# Patient Record
Sex: Female | Born: 1985 | Race: White | Hispanic: No | Marital: Single | Smoking: Never smoker
Health system: Southern US, Community
[De-identification: ages and names within clinical notes are randomized; demographics above are authoritative.]

## PROBLEM LIST (undated history)

## (undated) DIAGNOSIS — H6990 Unspecified Eustachian tube disorder, unspecified ear: Secondary | ICD-10-CM

## (undated) DIAGNOSIS — L719 Rosacea, unspecified: Secondary | ICD-10-CM

## (undated) DIAGNOSIS — L732 Hidradenitis suppurativa: Secondary | ICD-10-CM

## (undated) DIAGNOSIS — H6980 Other specified disorders of Eustachian tube, unspecified ear: Secondary | ICD-10-CM

## (undated) DIAGNOSIS — F411 Generalized anxiety disorder: Secondary | ICD-10-CM

## (undated) DIAGNOSIS — Q251 Coarctation of aorta: Secondary | ICD-10-CM

## (undated) DIAGNOSIS — B349 Viral infection, unspecified: Secondary | ICD-10-CM

## (undated) DIAGNOSIS — I10 Essential (primary) hypertension: Secondary | ICD-10-CM

## (undated) DIAGNOSIS — J309 Allergic rhinitis, unspecified: Secondary | ICD-10-CM

## (undated) HISTORY — DX: Coarctation of aorta: Q25.1

## (undated) HISTORY — DX: Rosacea, unspecified: L71.9

## (undated) HISTORY — DX: Generalized anxiety disorder: F41.1

## (undated) HISTORY — DX: Hidradenitis suppurativa: L73.2

## (undated) HISTORY — PX: ABLATION: SHX5711

## (undated) HISTORY — DX: Unspecified eustachian tube disorder, unspecified ear: H69.90

## (undated) HISTORY — DX: Allergic rhinitis, unspecified: J30.9

## (undated) HISTORY — DX: Other specified disorders of Eustachian tube, unspecified ear: H69.80

## (undated) HISTORY — DX: Essential (primary) hypertension: I10

## (undated) HISTORY — DX: Viral infection, unspecified: B34.9

---
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
## 2015-06-23 ENCOUNTER — Ambulatory Visit (INDEPENDENT_AMBULATORY_CARE_PROVIDER_SITE_OTHER): Payer: BLUE CROSS/BLUE SHIELD | Admitting: Podiatry

## 2015-06-23 ENCOUNTER — Encounter: Payer: Self-pay | Admitting: Podiatry

## 2015-06-23 ENCOUNTER — Ambulatory Visit (INDEPENDENT_AMBULATORY_CARE_PROVIDER_SITE_OTHER): Payer: BLUE CROSS/BLUE SHIELD

## 2015-06-23 VITALS — BP 119/73 | HR 69 | Resp 18

## 2015-06-23 DIAGNOSIS — M722 Plantar fascial fibromatosis: Secondary | ICD-10-CM | POA: Diagnosis not present

## 2015-06-23 MED ORDER — METHYLPREDNISOLONE 4 MG PO TBPK: ORAL_TABLET | ORAL | Status: DC

## 2015-06-23 MED ORDER — MELOXICAM 15 MG PO TABS: 15.0000 mg | ORAL_TABLET | Freq: Every day | ORAL | Status: DC

## 2015-06-23 NOTE — Patient Instructions (Signed)

## 2015-06-23 NOTE — Progress Notes (Signed)
   Subjective:    Patient ID: Joanne Acosta, female    DOB: 03-29-1986, 29 y.o.   MRN: [id number]  HPI i have some heel pain on both of my feet and it has been going on since may and hurts on the bottom and hurts in the morning. She states the majority of the pain occurs first thing in the morning and then after she's been standing at work. 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 She's tried nothing other than shoe gear change to alleviate her symptoms.    Review of Systems  All other systems reviewed and are negative.      Objective:   Physical Exam: 29 year old female presents with her mother today vital signs stable alert and oriented 3 in no apparent distress. Pulses are strongly palpable neurologic sensorium is intact per Semmes-Weinstein monofilament. Deep tendon reflexes are intact bilaterally muscle strength was 5 over 5 dorsiflexion plantar flexors and inverters and everters on physical musculatures intact. Orthopedic evaluation demonstrates a rectus foot bilateral. All joints distal to the ankle held full range of motion without crepitation. She has pain on palpation medial calcaneal tubercles bilateral heels. Radiographs confirm soft tissue increase in density at the plantar fascial calcaneal insertion site consistent with plantar fasciitis. Cutaneous evaluation demonstrates supple well-hydrated cutis no erythema edema and cellulitis drainage odor open wounds lesions or wounds. No signs of skin infection.        Assessment & Plan:  Assessment: Plantar fasciitis bilateral foot.  Plan: Discussed etiology pathology conservative versus surgical therapies. We discussed appropriate shoe gear stretching exercises ice therapy and shoe gear modifications. We started her on a Medrol Dosepak to be followed by meloxicam. I injected the bilateral heels today with Kenalog and local anesthetic placed her in plantar fascial  braces bilateral and single night splint. I recommended multiple types of shoe gear to help alleviate her symptoms. I will follow up with her 1 month.

## 2015-07-28 ENCOUNTER — Encounter: Payer: Self-pay | Admitting: Podiatry

## 2015-07-28 ENCOUNTER — Ambulatory Visit (INDEPENDENT_AMBULATORY_CARE_PROVIDER_SITE_OTHER): Payer: BLUE CROSS/BLUE SHIELD | Admitting: Podiatry

## 2015-07-28 DIAGNOSIS — M722 Plantar fascial fibromatosis: Secondary | ICD-10-CM | POA: Diagnosis not present

## 2015-07-28 NOTE — Progress Notes (Signed)
She presents stating that her plantar fasciitis is doing much better however but by the end of the day her feet hurt.  Objective: Vital signs are stable she is alert and oriented 3. Pain on palpation medial calcaneal tubercles bilateral. No calf pain. No open lesions or wounds.  Assessment: Plantar fasciitis bilateral improving.  Plan: Injected the bilateral heels today with Kenalog and local anesthetic. She will continue all conservative therapies and until completely well. Follow up with her in the near future.

## 2015-08-25 ENCOUNTER — Ambulatory Visit: Payer: BLUE CROSS/BLUE SHIELD | Admitting: Podiatry

## 2017-02-02 ENCOUNTER — Ambulatory Visit: Payer: Self-pay | Admitting: Cardiovascular Disease

## 2017-03-16 ENCOUNTER — Ambulatory Visit: Payer: Self-pay | Admitting: Cardiovascular Disease

## 2017-04-25 ENCOUNTER — Encounter: Payer: Self-pay | Admitting: *Deleted

## 2017-04-25 ENCOUNTER — Ambulatory Visit: Payer: Self-pay | Admitting: Internal Medicine

## 2017-06-19 ENCOUNTER — Ambulatory Visit: Payer: Self-pay | Admitting: Internal Medicine

## 2017-06-20 ENCOUNTER — Ambulatory Visit: Payer: Self-pay | Admitting: Internal Medicine

## 2017-09-19 ENCOUNTER — Ambulatory Visit: Payer: Self-pay | Admitting: Internal Medicine

## 2018-04-01 ENCOUNTER — Other Ambulatory Visit: Payer: Self-pay

## 2018-04-01 ENCOUNTER — Encounter: Payer: Self-pay | Admitting: Emergency Medicine

## 2018-04-01 ENCOUNTER — Emergency Department: Payer: PRIVATE HEALTH INSURANCE

## 2018-04-01 ENCOUNTER — Emergency Department
Admission: EM | Admit: 2018-04-01 | Discharge: 2018-04-01 | Disposition: A | Discharge status: Home / Self Care | Payer: PRIVATE HEALTH INSURANCE | Attending: Emergency Medicine | Admitting: Emergency Medicine

## 2018-04-01 DIAGNOSIS — Y9389 Activity, other specified: Secondary | ICD-10-CM | POA: Insufficient documentation

## 2018-04-01 DIAGNOSIS — M542 Cervicalgia: Secondary | ICD-10-CM | POA: Insufficient documentation

## 2018-04-01 DIAGNOSIS — M545 Low back pain: Secondary | ICD-10-CM | POA: Diagnosis not present

## 2018-04-01 DIAGNOSIS — Y92410 Unspecified street and highway as the place of occurrence of the external cause: Secondary | ICD-10-CM | POA: Insufficient documentation

## 2018-04-01 DIAGNOSIS — M25512 Pain in left shoulder: Secondary | ICD-10-CM | POA: Insufficient documentation

## 2018-04-01 DIAGNOSIS — Z79899 Other long term (current) drug therapy: Secondary | ICD-10-CM | POA: Insufficient documentation

## 2018-04-01 DIAGNOSIS — Y998 Other external cause status: Secondary | ICD-10-CM | POA: Diagnosis not present

## 2018-04-01 DIAGNOSIS — I10 Essential (primary) hypertension: Secondary | ICD-10-CM | POA: Insufficient documentation

## 2018-04-01 LAB — POC URINE PREG, ED: Preg Test, Ur: NEGATIVE

## 2018-04-01 MED ORDER — METHOCARBAMOL 500 MG PO TABS: 500.0000 mg | ORAL_TABLET | Freq: Three times a day (TID) | ORAL | 0 refills | Status: AC | PRN

## 2018-04-01 MED ORDER — KETOROLAC TROMETHAMINE 30 MG/ML IJ SOLN
30.0000 mg | Freq: Once | INTRAMUSCULAR | Status: AC
  Administered 2018-04-01: 30 mg via INTRAMUSCULAR
  Filled 2018-04-01: qty 1

## 2018-04-01 MED ORDER — MELOXICAM 15 MG PO TABS: 15.0000 mg | ORAL_TABLET | Freq: Every day | ORAL | 1 refills | Status: AC

## 2018-04-01 MED ORDER — METHOCARBAMOL 500 MG PO TABS
500.0000 mg | ORAL_TABLET | Freq: Once | ORAL | Status: AC
  Administered 2018-04-01: 500 mg via ORAL
  Filled 2018-04-01: qty 1
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
## 2019-07-09 ENCOUNTER — Other Ambulatory Visit: Payer: PRIVATE HEALTH INSURANCE

## 2019-07-09 ENCOUNTER — Ambulatory Visit: Payer: PRIVATE HEALTH INSURANCE | Attending: Internal Medicine

## 2019-07-09 DIAGNOSIS — Z20828 Contact with and (suspected) exposure to other viral communicable diseases: Secondary | ICD-10-CM | POA: Insufficient documentation

## 2019-07-09 DIAGNOSIS — Z20822 Contact with and (suspected) exposure to covid-19: Secondary | ICD-10-CM

## 2019-07-10 LAB — NOVEL CORONAVIRUS, NAA: SARS-CoV-2, NAA: NOT DETECTED

## 2019-09-01 ENCOUNTER — Ambulatory Visit: Payer: PRIVATE HEALTH INSURANCE | Attending: Internal Medicine

## 2019-09-01 DIAGNOSIS — Z20822 Contact with and (suspected) exposure to covid-19: Secondary | ICD-10-CM

## 2019-09-02 LAB — SPECIMEN STATUS REPORT

## 2019-09-02 LAB — NOVEL CORONAVIRUS, NAA: SARS-CoV-2, NAA: NOT DETECTED

## 2021-02-15 DIAGNOSIS — I4892 Unspecified atrial flutter: Secondary | ICD-10-CM | POA: Insufficient documentation

## 2021-10-29 DIAGNOSIS — Q249 Congenital malformation of heart, unspecified: Secondary | ICD-10-CM | POA: Insufficient documentation

## 2021-12-08 ENCOUNTER — Other Ambulatory Visit: Payer: Self-pay

## 2021-12-08 ENCOUNTER — Encounter: Payer: Self-pay | Admitting: Emergency Medicine

## 2021-12-08 ENCOUNTER — Emergency Department
Admission: EM | Admit: 2021-12-08 | Discharge: 2021-12-08 | Disposition: A | Discharge status: Home / Self Care | Payer: Managed Care, Other (non HMO) | Attending: Emergency Medicine | Admitting: Emergency Medicine

## 2021-12-08 ENCOUNTER — Emergency Department: Payer: Managed Care, Other (non HMO)

## 2021-12-08 DIAGNOSIS — M25551 Pain in right hip: Secondary | ICD-10-CM | POA: Insufficient documentation

## 2021-12-08 DIAGNOSIS — M545 Low back pain, unspecified: Secondary | ICD-10-CM | POA: Diagnosis not present

## 2021-12-08 DIAGNOSIS — S0990XA Unspecified injury of head, initial encounter: Secondary | ICD-10-CM | POA: Diagnosis present

## 2021-12-08 DIAGNOSIS — Z7901 Long term (current) use of anticoagulants: Secondary | ICD-10-CM | POA: Diagnosis not present

## 2021-12-08 DIAGNOSIS — S0181XA Laceration without foreign body of other part of head, initial encounter: Secondary | ICD-10-CM | POA: Diagnosis not present

## 2021-12-08 DIAGNOSIS — M25552 Pain in left hip: Secondary | ICD-10-CM | POA: Diagnosis not present

## 2021-12-08 DIAGNOSIS — S39012A Strain of muscle, fascia and tendon of lower back, initial encounter: Secondary | ICD-10-CM

## 2021-12-08 DIAGNOSIS — Y92481 Parking lot as the place of occurrence of the external cause: Secondary | ICD-10-CM | POA: Insufficient documentation

## 2021-12-08 MED ORDER — LIDOCAINE 5 % EX PTCH: 1.0000 | MEDICATED_PATCH | Freq: Two times a day (BID) | CUTANEOUS | 0 refills | Status: AC
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
## 2021-12-08 NOTE — Discharge Instructions (Signed)
Use Tylenol for pain and fevers.  Up to 1000 mg per dose, up to 4 times per day.  Do not take more than 4000 mg of Tylenol/acetaminophen within 24 hours.. ° °Please use lidocaine patches at your site of pain.  Apply 1 patch at a time, leave on for 12 hours, then remove for 12 hours.  12 hours on, 12 hours off.  Do not apply more than 1 patch at a time. ° °
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
## 2021-12-08 NOTE — ED Notes (Signed)
E signature pad not working. Pt educated on discharge instructions and verbalized understanding.  

## 2021-12-10 ENCOUNTER — Emergency Department
Admission: EM | Admit: 2021-12-10 | Discharge: 2021-12-10 | Disposition: A | Discharge status: Home / Self Care | Payer: Managed Care, Other (non HMO) | Attending: Emergency Medicine | Admitting: Emergency Medicine

## 2021-12-10 ENCOUNTER — Encounter: Payer: Self-pay | Admitting: Medical Oncology

## 2021-12-10 DIAGNOSIS — H1131 Conjunctival hemorrhage, right eye: Secondary | ICD-10-CM | POA: Diagnosis not present

## 2021-12-10 DIAGNOSIS — I10 Essential (primary) hypertension: Secondary | ICD-10-CM | POA: Diagnosis not present

## 2021-12-10 DIAGNOSIS — I4891 Unspecified atrial fibrillation: Secondary | ICD-10-CM | POA: Insufficient documentation

## 2021-12-10 DIAGNOSIS — H538 Other visual disturbances: Secondary | ICD-10-CM | POA: Diagnosis present
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
## 2022-06-06 DIAGNOSIS — I480 Paroxysmal atrial fibrillation: Secondary | ICD-10-CM | POA: Insufficient documentation

## 2022-08-11 DIAGNOSIS — Q2521 Interruption of aortic arch: Secondary | ICD-10-CM | POA: Insufficient documentation
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
## 2024-01-07 ENCOUNTER — Ambulatory Visit (INDEPENDENT_AMBULATORY_CARE_PROVIDER_SITE_OTHER)

## 2024-01-07 ENCOUNTER — Ambulatory Visit: Admitting: Podiatry

## 2024-01-07 ENCOUNTER — Encounter: Payer: Self-pay | Admitting: Podiatry

## 2024-01-07 DIAGNOSIS — M722 Plantar fascial fibromatosis: Secondary | ICD-10-CM | POA: Diagnosis not present

## 2024-01-07 MED ORDER — TRIAMCINOLONE ACETONIDE 40 MG/ML IJ SUSP
40.0000 mg | Freq: Once | INTRAMUSCULAR | Status: AC
  Administered 2024-01-07: 40 mg

## 2024-01-07 MED ORDER — METHYLPREDNISOLONE 4 MG PO TBPK: ORAL_TABLET | ORAL | 0 refills | Status: DC

## 2024-01-07 NOTE — Progress Notes (Signed)
 Subjective:  Patient ID: Joanne Acosta, female    DOB: June 15, 1986,  MRN: [id number] HPI Chief Complaint  Patient presents with   Foot Pain    Flare of PF bilateral (L>R) - started hurting again about 2 weeks ago, worse at end of day after on it, feels pain to lateral side, Urgent Care eval-Rx'd muscle relaxer-took as needed   New Patient (Initial Visit)    Est pt 2017    38 y.o. female presents with the above complaint.   ROS: Denies fever chills nausea mobic  muscle aches pains calf pain back pain chest pain shortness of breath.  Past Medical History:  Diagnosis Date   Allergic rhinitis    Anxiety state    Coarctation of aorta    Eustachian tube dysfunction    Hypertension    Rosacea    Suppurative hidradenitis    Viral syndrome    No past surgical history on file.  Current Outpatient Medications:    apixaban (ELIQUIS) 5 MG TABS tablet, Take 5 mg by mouth., Disp: , Rfl:    cetirizine (ZYRTEC) 10 MG tablet, , Disp: , Rfl:    methylPREDNISolone  (MEDROL  DOSEPAK) 4 MG TBPK tablet, 6 day dose pack - take as directed, Disp: 21 tablet, Rfl: 0   orphenadrine (NORFLEX) 100 MG tablet, Take 100 mg by mouth., Disp: , Rfl:    captopril (CAPOTEN) 12.5 MG tablet, Take 12.5 mg by mouth. (Patient not taking: Reported on 12/08/2021), Disp: , Rfl:    metoprolol succinate (TOPROL-XL) 25 MG 24 hr tablet, Take 75 mg by mouth daily., Disp: , Rfl:    sertraline (ZOLOFT) 50 MG tablet, Take 50 mg by mouth daily., Disp: , Rfl:   Allergies  Allergen Reactions   Brompheniramine Other (See Comments)    Cannot have decongestants because of the tube.   Decongestant [Oxymetazoline]    Phenylephrine    Diphenhydramine-Phenylephrine Nausea And Vomiting   Review of Systems Objective:  There were no vitals filed for this visit.  General: Well developed, nourished, in no acute distress, alert and oriented x3   Dermatological: Skin is warm, dry and supple bilateral. Nails x 10 are well maintained;  remaining integument appears unremarkable at this time. There are no open sores, no preulcerative lesions, no rash or signs of infection present.  Vascular: Dorsalis Pedis artery and Posterior Tibial artery pedal pulses are 2/4 bilateral with immedate capillary fill time. Pedal hair growth present. No varicosities and no lower extremity edema present bilateral.   Neruologic: Grossly intact via light touch bilateral. Vibratory intact via tuning fork bilateral. Protective threshold with Semmes Wienstein monofilament intact to all pedal sites bilateral. Patellar and Achilles deep tendon reflexes 2+ bilateral. No Babinski or clonus noted bilateral.   Musculoskeletal: No gross boney pedal deformities bilateral. No pain, crepitus, or limitation noted with foot and ankle range of motion bilateral. Muscular strength 5/5 in all groups tested bilateral.  Pain on palpation medial calcaneal tubercle bilateral heel no pain on palpation of the calf or the posterior heel.  Gait: Unassisted, Nonantalgic.    Radiographs: Radiographs taken today demonstrate osseously mature individual with plantar and posterior calcaneal heel spurs.  Soft tissue increase in density of plantar fascial cannula insertion site bilaterally.  No acute findings noted.  Os peroneum is also present bilaterally.  Assessment & Plan:   Assessment: Plantar fasciitis bilateral.    Plan: Start her on methylprednisolone  and injected bilateral heels.  Follow-up with her in 6 weeks     Donicia Druck  T. [place], [place]

## 2024-02-18 ENCOUNTER — Ambulatory Visit: Admitting: Podiatry

## 2024-02-18 DIAGNOSIS — M722 Plantar fascial fibromatosis: Secondary | ICD-10-CM | POA: Diagnosis not present

## 2024-02-18 MED ORDER — CELECOXIB 100 MG PO CAPS
100.0000 mg | ORAL_CAPSULE | Freq: Two times a day (BID) | ORAL | 3 refills | Status: AC
Start: 2024-02-18 — End: ?

## 2024-02-18 NOTE — Progress Notes (Signed)
 She presents today for follow-up of her plantar fasciitis she states that she is 95% improved at this point.  Still has some pain along the lateral side.  Objective: Vital signs stable alert oriented x 3.  Pulses are palpable.  She has no reproducible tenderness on palpation today.  Assessment: Plantar fasciitis with some residual lateral pain left greater than right.  Plan: She spoke to her cardiologist and was told that she could use nonsteroidals periodically.  I have started her on Celebrex  100 mg once or twice daily for a week at a time and then off for 2 weeks.  Follow-up with her as needed

## 2024-05-19 NOTE — ED Provider Notes (Signed)
 [facility] Provider Note     ED Clinical Impression   Final diagnoses:  Acute cough (Primary)    Presenting History   HPI  May 19, 2024 8:57 AM  Joanne Acosta is a 38 y.o. 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 The patient reports that 3 weeks ago, she was diagnosed with bronchitis and was prescribed doxycycline and a cough suppressant by her PCP. She states that initially her symptoms were improving, however, her husband reports that over the weekend her symptoms progressively worsened such that her coughing fits became more frequent. At present, she endorses having a non-productive cough with associated upper chest pain and bilateral rib pain secondary to frequent coughing, fever (Tmax 100.4 F axillary), and sore throat. She also reports some BLE swelling. She reports taking cough syrup with codeine at home along with Flonase, which have provided mild symptomatic relief. She does report taking allergy medication. She denies a history of blood clots in her legs or lungs. She denies any known sick contacts.    Physical Exam and MDM   BP 130/78   Pulse 67   Temp 36.7 C (98.1 F) (Oral)   Resp 17   Wt (!) 122.4 kg (269 lb 13.5 oz)   SpO2 97%   BMI 42.26 kg/m  general: Awake, alert and oriented, in no distress. Skin: Skin is warm and dry. HEENT: Cobblestoning of the posterior oropharynx; mild conjunctival injection bilaterally without drainage Lungs: Normal respiratory effort. Breath sounds clear bilaterally. Occasional cough. Heart: Regular rhythm, normal heart sounds, no JVD. Chest Wall: No rash, chest pain not reproducible on palpation, no crepitus. Abdomen: Soft , non-distended, and non-tender to palpation. Musculoskeletal: No  deformities or tenderness. Extremities: No peripheral edema, 2+ radial pulses symmetric bilaterally. Neurological: No gross focal neurologic deficits are appreciated. Psychiatric: Normal affect and behavior for situation  MDM: Joanne Acosta is 38 y.o. 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 They deny shortness of breath, hemoptysis, and there are no signs of respiratory distress. Vitals are reviewed and are within acceptable limits, with hypertension improved on repeat, and oxygen saturation is normal on room air. On physical exam, the patient is well-appearing, with no signs of respiratory distress; lungs are clear to auscultation without wheezing, rales, or rhonchi. She does have occasional cough. Given the persistence of symptoms and to rule out pneumonia or other significant pathology, a chest X-ray was obtained. Further, check basic labs including a troponin to ensure there is no cardiac cause of her chest pain, with EKG as well. Based on clinical presentation, physical exam, and imaging findings, the most likely diagnosis is viral upper respiratory infection. The patient is tolerating oral intake, is hemodynamically stable, satting well on room air and does not meet criteria for inpatient admission. Supportive care and strict return precautions were discussed. The patient was discharged with outpatient follow-up instructions.  Plan for workup and treatment as below.  Plan for workup including  Orders Placed This Encounter  Procedures  . Respiratory Pathogen Panel with COVID (Nasopharyngeal)  . XR Chest Portable  . HS  Troponin 0h  . CBC w/ Differential  . Comprehensive Metabolic Panel  . Protime-INR   . ECG 12 Lead    Plan for treatment with  Medications - No data to display  Will reassess as we get results and update below  ED Course as of 05/21/24 2128  Mon May 19, 2024  1040 ECG 12 Lead My independent interpretation:   Normal rate, sinus rhythm Normal axis QRS, PR, QTc intervals are within normal limits No ST segment elevations or depressions No evidence of ischemia or infarct   1041 XR Chest Portable My independent interpretation: cardiomegaly, airway midline, bones without abnormality, possible left pleural effusion with blunting of the cost Frank ankle, increased reticular markings bilaterally concerning for viral process versus possible edema  1237 hsTroponin I: <3    I discussed the workup with the patient.  Patient is very reassured.  Will prescribe tessalon perles and discussed cough management. We discussed return precautions.  Patient is stable at this time for discharge home with recommendation for follow-up in the outpatient setting with primary care physician.    Discussion of Management with other Physicians, QHP, or Appropriate Source:   See ED course above for documentation of pages sent, and discussions with collaborating professionals. Independent Interpretation of Studies: See ED Course above for my independent interpretations.  External Records Reviewed: In addition to as noted in HPI. 06/12/23 [facility] Progress Note If Patient Not Admitted--Escalation of Care, Consideration of Admission/Observation/Transfer:  Social determinants that significantly affected care: In addition to as noted previously above:  Prescription drug(s) considered but not prescribed: In addition to as noted previously above:  Diagnostic tests considered but not performed: In addition to as noted previously above:        _____________________________________________________________________  The case was discussed with attending physician who is in agreement with the  above assessment and plan  Additional Medical Decision Making   I have reviewed the vital signs and the nursing notes. Labs and radiology results that were available during my care of the patient were independently reviewed by me and considered in my medical decision making.  I independently visualized the EKG tracing if performed I independently visualized the radiology images if performed I reviewed the patient's prior medical records if available. Additional history obtained from family if available.  If patient prefers a language other than English, I have used an interpreter or interpreting service for our interactions unless directed otherwise by the patient.  Other History   CHIEF COMPLAINT:  Chief Complaint  Patient presents with  . Chest Pain    PAST MEDICAL HISTORY/PAST SURGICAL HISTORY:  Past Medical History[1]  Past Surgical History[2]  MEDICATIONS:  No current facility-administered medications for this encounter.  Current Outpatient Medications:  .  amoxicillin (AMOXIL) 500 MG capsule, TAKE FOUR CAPSULES BY MOUTH ONE HOUR BEFORE APPOINTMENT, Disp: , Rfl:  .  benzonatate (TESSALON) 100 MG capsule, Take 1 capsule (100 mg total) by mouth every eight (8) hours for 7 days., Disp: 21 capsule, Rfl: 0 .  cetirizine (ZYRTEC) 10 MG tablet, , Disp: , Rfl:  .  cholecalciferol, vitamin D3 25 mcg, 1,000 units,, 1,000 unit (25 mcg) tablet, , Disp: , Rfl:  .  ELIQUIS 5 mg Tab, Take 1 tablet (5 mg total) by mouth two times a day., Disp: 180 tablet, Rfl: 3 .  metFORMIN (GLUCOPHAGE-XR) 500 MG 24 hr tablet, Take 1 tablet (500 mg total) by mouth two (2) times a day., Disp: 120 tablet, Rfl: 3 .  metoPROLOL succinate (TOPROL-XL) 25 MG 24 hr tablet, Take 1 tablet (25 mg total) by mouth daily. Last dose 08/21/22, Disp: 90 tablet, Rfl: 1 .  sertraline (ZOLOFT) 50 MG tablet, Take 1 tablet (50 mg total) by mouth daily. Take morning of surgery with sip of water, Disp: , Rfl:   ALLERGIES:   Brompheniramine maleate, Nasal decongestant [phenylephrine hcl], Oxymetazoline, Phenylephrine, and Sudafed cold-allergy  SOCIAL HISTORY:  Social History   Tobacco Use  . Smoking status: Never  . Smokeless tobacco: Never  Substance Use Topics  . Alcohol use: Never    FAMILY HISTORY: Family History[3]     Radiology   XR Chest Portable  Final Result    No acute abnormalities.          Labs   Labs Reviewed  COMPREHENSIVE METABOLIC PANEL - Abnormal; Notable for the following components:      Result Value   BUN 7 (*)    All other components within normal limits  CBC W/ AUTO DIFF - Abnormal; Notable for the following components:   Absolute Monocytes 0.9 (*)    All other components within normal limits  RESPIRATORY PATHOGEN PANEL - Normal   Narrative:    This result was obtained using the FDA-cleared [employer] Respiratory 2.1 Panel. Performance characteristics have been established and verified by the Clinical Molecular Microbiology Laboratory, [facility]. This assay does not distinguish between rhinovirus and enterovirus. Lower respiratory specimens will not be tested for Bordetella pertussis/parapertussis. For nasopharyngeal swabs, cross-reactivity may occur between B. pertussis and non-pertussis Bordetella species.  HIGH SENSITIVITY TROPONIN I - SINGLE - Normal  CBC W/ DIFFERENTIAL   Narrative:    The following orders were created for panel order CBC w/ Differential.                 Procedure                               Abnormality         Status                                    ---------                               -----------         ------                                    CBC w/ [id number]]         Abnormal            Final result                                               Please view results for these tests on the individual orders.  PROTIME-INR    Please note- This chart has been created using [employer]. Chart creation errors  have been sought, but may not always be located and such creation errors, especially pronoun confusion, do NOT reflect on the standard of medical care.  Documentation assistance was provided by [employer], Scribe on  May 19, 2024 at 11:06 AM for Vannie Rands, MD.   Documentation assistance was provided by the scribe in my presence.  The documentation recorded by the scribe has been reviewed by me and accurately reflects the services I personally performed.          [1] Past Medical History: Diagnosis Date  . Anxiety   . Aortic anomaly (HHS-HCC)   . Asthma (HHS-HCC)    Kid  . Atrial fibrillation and flutter (CMS-HCC)   . Cataract   . Difficult intravenous access   . Hypertension    as a child  . Obesity   [2] Past Surgical History: Procedure Laterality Date  . AORTIC VALVE SURGERY    . PR CARDIOVERSION, ELECTIVE;EXTERN N/A 05/08/2022   Procedure: CARDIOVERSION, ELECTIVE, ELECTRICAL CONVERSION OF ARRHYTHMIA; EXTERNAL;  Surgeon: Jama Margery Gravel, MD;  Location: [facility];  Service: Cardiology  . PR COMPRE EP EVAL ABLTJ 3D MAPG TX SVT N/A 02/23/2021   Procedure: A-Flutter Ablation;  Surgeon: Donnice Garnette Lee, MD;  Location: [facility];  Service: Cardiology  . PR COMPRE EP EVAL ABLTJ 3D MAPG TX SVT N/A 02/17/2022   Procedure: A-Flutter Ablation;  Surgeon: Anil Kishin Gehi, MD;  Location: [facility];  Service: Cardiology  . PR COMPRE EP EVAL ABLTJ ATR FIB PULM VEIN ISOLATION N/A 08/23/2022   Procedure: ATRIAL FIBRILLATION ABLATION;  Surgeon: Sharron Belvie Barge, MD;  Location: [facility];  Service: Cardiology  . PR ECHO TRANSESOPH, FOR MONITORING N/A 05/08/2022   Procedure: ECHOCARDIOGRAPHY, TRANSESOPHAGEAL FOR MONITORING PURPOSES, INCLUDING PROBE PLACEMENT;  Surgeon: Jama Margery Gravel, MD;  Location: [facility];  Service: Cardiology  [3] Family History Problem Relation Age of Onset  . Arthritis Mother        In spine  . Glaucoma  Mother   . COPD Father 42 - 30  . Hypertension Father 56 - 70   Bussey-Spencer, Vannie NOVAK, MD Resident 05/21/24 2129

## 2024-06-07 ENCOUNTER — Ambulatory Visit (INDEPENDENT_AMBULATORY_CARE_PROVIDER_SITE_OTHER)

## 2024-06-07 ENCOUNTER — Other Ambulatory Visit: Payer: Self-pay

## 2024-06-07 ENCOUNTER — Ambulatory Visit: Admission: EM | Admit: 2024-06-07 | Discharge: 2024-06-07 | Disposition: A | Discharge status: Home / Self Care

## 2024-06-07 DIAGNOSIS — J206 Acute bronchitis due to rhinovirus: Secondary | ICD-10-CM | POA: Diagnosis not present

## 2024-06-07 DIAGNOSIS — R059 Cough, unspecified: Secondary | ICD-10-CM | POA: Diagnosis not present

## 2024-06-07 DIAGNOSIS — J9801 Acute bronchospasm: Secondary | ICD-10-CM

## 2024-06-07 MED ORDER — MUCINEX DM MAXIMUM STRENGTH 60-1200 MG PO TB12: 1.0000 | ORAL_TABLET | Freq: Two times a day (BID) | ORAL | 0 refills | Status: AC

## 2024-06-07 MED ORDER — BENZONATATE 200 MG PO CAPS: 200.0000 mg | ORAL_CAPSULE | Freq: Three times a day (TID) | ORAL | 0 refills | Status: AC | PRN

## 2024-06-07 MED ORDER — DEXAMETHASONE SOD PHOSPHATE PF 10 MG/ML IJ SOLN
10.0000 mg | Freq: Once | INTRAMUSCULAR | Status: AC
  Administered 2024-06-07: 10 mg via INTRAMUSCULAR

## 2024-06-07 MED ORDER — AZITHROMYCIN 250 MG PO TABS: 250.0000 mg | ORAL_TABLET | Freq: Every day | ORAL | 0 refills | Status: AC

## 2024-06-07 MED ORDER — PREDNISONE 10 MG (21) PO TBPK
ORAL_TABLET | Freq: Every day | ORAL | 0 refills | Status: AC
Start: 2024-06-07 — End: ?

## 2024-06-07 MED ORDER — IPRATROPIUM-ALBUTEROL 0.5-2.5 (3) MG/3ML IN SOLN
3.0000 mL | Freq: Once | RESPIRATORY_TRACT | Status: AC
  Administered 2024-06-07: 3 mL via RESPIRATORY_TRACT

## 2024-06-07 NOTE — Discharge Instructions (Addendum)
 You have been diagnosed with acute bronchitis, which is a sudden inflammation of the large airways in your lungs. This inflammation causes the airways to narrow and produce more mucus, making it harder to breathe and leading to coughing. Acute bronchitis is most often caused by the same viruses that cause the common cold. Take the medications that were prescribed to you as directed. If you have a fever, headache, or body aches, you can also take Tylenol to help you feel more comfortable. Be sure to drink plenty of fluids to stay hydrated--aim for enough to keep your urine a pale yellow color. This will also help to thin mucus and make it easier to clear from your body. The prescribed cough medicine should help loosen mucus, relieve congestion, and ease your cough. Taking two teaspoons of honey at bedtime may also help reduce nighttime coughing. Avoid using any nicotine or tobacco products, as they can worsen your symptoms and delay healing.   Using a cool mist humidifier at home to keep humidity levels above 50% can be helpful. You can also inhale steam for 10 to 15 minutes, 3 to 4 times a day. This can be done by sitting in the bathroom with a hot shower running, or by using over-the-counter vapor shower tablets to help with nasal congestion. Try to avoid cool or dry air as much as possible. Be sure to get enough rest every night to support your recovery. Don't forget to replace your toothbrush once you start feeling better.   It's normal for a cough to linger for several weeks after a respiratory illness, even after other symptoms have resolved. This happens because the airways remain irritated and take time to fully heal. As long as the cough gradually improves and there are no new concerning symptoms, this is part of the normal recovery process. Seek emergency care right away if you cough up blood, feel chest pain, have severe shortness of breath, faint or feel like you might faint, develop a severe  headache, or experience worsening fever or chills.

## 2024-06-07 NOTE — ED Provider Notes (Signed)
 CAY RALPH PELT    CSN: [id number] Arrival date & time: 06/07/24  1106      History   Chief Complaint Chief Complaint  Patient presents with   Cough    HPI Joanne Acosta is a 38 y.o. female.   Discussed the use of AI scribe software for clinical note transcription with the patient, who gave verbal consent to proceed.   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 She presents with chest pain in the setting of a persistent subacute cough. She reports that the cough has been ongoing for over one month without improvement. She was initially evaluated by her primary care provider and was prescribed a course of doxycycline, which she completed without relief. A few weeks later, she visited another urgent care for persistent symptoms but did not receive treatment at that time. Approximately one week afterward, she was seen in the emergency department where chest X-ray and further workup were negative; Tessalon Perles were prescribed and have since been completed.  She continues to manage her symptoms with honey, warm tea, and increased fluid intake. She reports wheezing and shortness of breath primarily at night. She notes associated chest pain, back pain, and lower abdominal discomfort which she attributes to prolonged coughing.  The following sections of the patient's history were reviewed and updated as appropriate: allergies, current medications, past family history, past medical history, past social history, past surgical history, and problem list.     Past Medical History:  Diagnosis Date   Allergic rhinitis    Anxiety state    Coarctation of aorta    Eustachian tube dysfunction    Hypertension    Rosacea    Suppurative hidradenitis    Viral  syndrome     Patient Active Problem List   Diagnosis Date Noted   Congenital interruption of aortic arch 08/11/2022   Paroxysmal atrial fibrillation (HCC) 06/06/2022   Congenital heart disease 10/29/2021   Atrial flutter (HCC) 02/15/2021    Past Surgical History:  Procedure Laterality Date   ABLATION      OB History   No obstetric history on file.      Home Medications    Prior to Admission medications   Medication Sig Start Date End Date Taking? Authorizing Provider  albuterol (VENTOLIN HFA) 108 (90 Base) MCG/ACT inhaler Inhale 2 puffs into the lungs every 6 (six) hours as needed for wheezing. 04/21/24  Yes [provider]  azithromycin (ZITHROMAX) 250 MG tablet Take 1 tablet (250 mg total) by mouth daily. Take first 2 tablets together, then 1 every day until finished. 06/07/24  Yes Iola Lukes, FNP  benzonatate (TESSALON) 200 MG capsule Take 1 capsule (200 mg total) by mouth 3 (three) times daily as needed for cough. 06/07/24  Yes Kerianna Rawlinson, Lukes, FNP  Dextromethorphan-guaiFENesin (MUCINEX DM MAXIMUM STRENGTH) 60-1200 MG TB12 Take 1 tablet by mouth 2 (two) times daily. 06/07/24  Yes Lorriane Dehart, FNP  metFORMIN (GLUCOPHAGE-XR) 500 MG 24 hr tablet Take 500 mg by mouth daily with breakfast. 02/21/24  Yes [provider]  predniSONE (STERAPRED UNI-PAK 21 TAB) 10 MG (21) TBPK tablet Take by mouth daily. Take 6 tabs by mouth daily  for 2 days, then 5 tabs for 2 days, then 4 tabs for 2 days, then 3 tabs for 2 days, 2 tabs  for 2 days, then 1 tab by mouth daily for 2 days 06/07/24  Yes Iola Lukes, FNP  apixaban (ELIQUIS) 5 MG TABS tablet Take 5 mg by mouth. 06/12/23   [provider]  captopril (CAPOTEN) 12.5 MG tablet Take 12.5 mg by mouth. Patient not taking: Reported on 12/08/2021 09/26/10   [provider]  celecoxib  (CELEBREX ) 100 MG capsule Take 1 capsule (100 mg total) by mouth 2 (two) times daily. 02/18/24   Hyatt, Max T, DPM   cetirizine (ZYRTEC) 10 MG tablet  10/11/23   [provider]  metoprolol succinate (TOPROL-XL) 25 MG 24 hr tablet Take 75 mg by mouth daily. 10/28/21   [provider]  sertraline (ZOLOFT) 50 MG tablet Take 50 mg by mouth daily. 11/09/21   [provider]    Family History History reviewed. No pertinent family history.  Social History Social History   Tobacco Use   Smoking status: Never   Smokeless tobacco: Never  Vaping Use   Vaping status: Never Used  Substance Use Topics   Alcohol use: Never   Drug use: Never     Allergies   Brompheniramine, Decongestant [oxymetazoline], Phenylephrine, and Diphenhydramine-phenylephrine   Review of Systems Review of Systems  Respiratory:  Positive for cough.   All other systems reviewed and are negative.    Physical Exam Triage Vital Signs ED Triage Vitals [06/07/24 1202]  Encounter Vitals Group     BP      Girls Systolic BP Percentile      Girls Diastolic BP Percentile      Boys Systolic BP Percentile      Boys Diastolic BP Percentile      Pulse      Resp      Temp      Temp src      SpO2      Weight      Height      Head Circumference      Peak Flow      Pain Score 3     Pain Loc      Pain Education      Exclude from Growth Chart    No data found.  Updated Vital Signs LMP 05/12/2024 (Approximate)   Visual Acuity Right Eye Distance:   Left Eye Distance:   Bilateral Distance:    Right Eye Near:   Left Eye Near:    Bilateral Near:     Physical Exam Vitals reviewed.  Constitutional:      General: She is awake. She is not in acute distress.    Appearance: Normal appearance. She is well-developed. She is obese. She is not ill-appearing, toxic-appearing or diaphoretic.  HENT:     Head: Normocephalic.     Right Ear: Tympanic membrane, ear canal and external ear normal. No drainage, swelling or tenderness. No middle ear effusion. Tympanic membrane is not erythematous.     Left Ear:  Tympanic membrane, ear canal and external ear normal. No drainage, swelling or tenderness.  No middle ear effusion. Tympanic membrane is not erythematous.     Nose: Congestion present. No rhinorrhea.     Mouth/Throat:     Lips: Pink.     Mouth: Mucous membranes are moist.     Pharynx: No pharyngeal swelling, oropharyngeal exudate, posterior oropharyngeal erythema or uvula swelling.     Tonsils: No tonsillar exudate or tonsillar abscesses.  Eyes:     General: Vision grossly intact.     Conjunctiva/sclera: Conjunctivae normal.  Cardiovascular:     Rate and Rhythm: Normal rate.     Heart sounds: Normal heart sounds.  Pulmonary:     Effort: Pulmonary effort is normal. No tachypnea or respiratory distress.     Breath sounds: Normal breath sounds and air entry.     Comments: Persistent dry cough noted on exam with normal lung sounds. Respirations even and unlabored.  Musculoskeletal:        General: Normal range of motion.     Cervical back: Normal range of motion and neck supple.  Lymphadenopathy:     Cervical: No cervical adenopathy.  Skin:    General: Skin is warm and dry.  Neurological:     General: No focal deficit present.     Mental Status: She is alert and oriented to person, place, and time.  Psychiatric:        Behavior: Behavior is cooperative.      UC Treatments / Results  Labs (all labs ordered are listed, but only abnormal results are displayed) Labs Reviewed - No data to display  EKG   Radiology DG Chest 2 View Result Date: 06/07/2024 EXAM: 2 VIEW(S) XRAY OF THE CHEST 06/07/2024 12:29:32 PM COMPARISON: 04/01/2018 CLINICAL HISTORY: cough x 1 month; getting worse FINDINGS: LUNGS AND PLEURA: No focal pulmonary opacity. No pleural effusion. No pneumothorax. HEART AND MEDIASTINUM: Stable cardiomegaly. Sternotomy wires are noted. BONES AND SOFT TISSUES: Sternotomy wires are noted. No acute osseous abnormality. IMPRESSION: 1. No acute cardiopulmonary process.  Electronically signed by: Lynwood Seip MD 06/07/2024 12:57 PM EST RP Workstation: [id number]    Procedures Procedures (including critical care time)  Medications Ordered in UC Medications  ipratropium-albuterol (DUONEB) 0.5-2.5 (3) MG/3ML nebulizer solution 3 mL (3 mLs Nebulization Given 06/07/24 1350)  dexamethasone (DECADRON) injection 10 mg (10 mg Intramuscular Given 06/07/24 1350)    Initial Impression / Assessment and Plan / UC Course  I have reviewed the triage vital signs and the nursing notes.  Pertinent labs & imaging results that were available during my care of the patient were reviewed by me and considered in my medical decision making (see chart for details).     The patient presents with persistent subacute cough for over one month associated with nighttime wheezing, shortness of breath, and musculoskeletal chest, back, and lower abdominal pain from repetitive coughing. She has an extensive cardiac history including atrial flutter on Eliquis status post ablation, congenital heart disease with multiple repairs, hypertension, and asthma. She has completed prior courses of doxycycline and Tessalon without relief, and a recent emergency department evaluation including chest X-ray was negative. Today she is alert, oriented, afebrile, and nontoxic. Lung auscultation is clear without wheezes, rales, or rhonchi; however, frequent coughing consistent with bronchospasm was observed. Given the chronic cough with nocturnal wheezing and reassuring imaging, the presentation is most consistent with bronchospasm/reactive airway flare rather than pneumonia or acute cardiac pathology. She was treated in clinic with a Duoneb breathing treatment and intramuscular Decadron with improvement. She was prescribed azithromycin, Mucinex DM for mucus and cough, benzonatate for cough suppression, and a short oral steroid taper to reduce airway inflammation. She was advised to follow up with her primary care  provider or pulmonology for further evaluation of chronic cough if symptoms persist or recur. Emergency evaluation was advised for worsening shortness of breath, chest pain concerning for cardiac origin, syncope, new fever, hemoptysis, or inability to breathe comfortably at rest.  Today's evaluation has revealed no signs of a dangerous process. Discussed diagnosis with patient  and/or guardian. Patient and/or guardian aware of their diagnosis, possible red flag symptoms to watch out for and need for close follow up. Patient and/or guardian understands verbal and written discharge instructions. Patient and/or guardian comfortable with plan and disposition.  Patient and/or guardian has a clear mental status at this time, good insight into illness (after discussion and teaching) and has clear judgment to make decisions regarding their care  Documentation was completed with the aid of voice recognition software. Transcription may contain typographical errors.  Final Clinical Impressions(s) / UC Diagnoses   Final diagnoses:  Cough, unspecified type  Acute bronchitis due to Rhinovirus  Bronchospasm     Discharge Instructions      You have been diagnosed with acute bronchitis, which is a sudden inflammation of the large airways in your lungs. This inflammation causes the airways to narrow and produce more mucus, making it harder to breathe and leading to coughing. Acute bronchitis is most often caused by the same viruses that cause the common cold. Take the medications that were prescribed to you as directed. If you have a fever, headache, or body aches, you can also take Tylenol to help you feel more comfortable. Be sure to drink plenty of fluids to stay hydrated--aim for enough to keep your urine a pale yellow color. This will also help to thin mucus and make it easier to clear from your body. The prescribed cough medicine should help loosen mucus, relieve congestion, and ease your cough. Taking two  teaspoons of honey at bedtime may also help reduce nighttime coughing. Avoid using any nicotine or tobacco products, as they can worsen your symptoms and delay healing.   Using a cool mist humidifier at home to keep humidity levels above 50% can be helpful. You can also inhale steam for 10 to 15 minutes, 3 to 4 times a day. This can be done by sitting in the bathroom with a hot shower running, or by using over-the-counter vapor shower tablets to help with nasal congestion. Try to avoid cool or dry air as much as possible. Be sure to get enough rest every night to support your recovery. Don't forget to replace your toothbrush once you start feeling better.   It's normal for a cough to linger for several weeks after a respiratory illness, even after other symptoms have resolved. This happens because the airways remain irritated and take time to fully heal. As long as the cough gradually improves and there are no new concerning symptoms, this is part of the normal recovery process. Seek emergency care right away if you cough up blood, feel chest pain, have severe shortness of breath, faint or feel like you might faint, develop a severe headache, or experience worsening fever or chills.       ED Prescriptions     Medication Sig Dispense Auth. Provider   predniSONE (STERAPRED UNI-PAK 21 TAB) 10 MG (21) TBPK tablet Take by mouth daily. Take 6 tabs by mouth daily  for 2 days, then 5 tabs for 2 days, then 4 tabs for 2 days, then 3 tabs for 2 days, 2 tabs for 2 days, then 1 tab by mouth daily for 2 days 42 tablet Chyla Schlender, [place], FNP   Dextromethorphan-guaiFENesin (MUCINEX DM MAXIMUM STRENGTH) 60-1200 MG TB12 Take 1 tablet by mouth 2 (two) times daily. 20 tablet Iola Lukes, FNP   benzonatate (TESSALON) 200 MG capsule Take 1 capsule (200 mg total) by mouth 3 (three) times daily as needed for cough. 30 capsule Brande Uncapher,  Lucie, FNP   azithromycin (ZITHROMAX) 250 MG tablet Take 1 tablet (250 mg total)  by mouth daily. Take first 2 tablets together, then 1 every day until finished. 6 tablet Iola Lucie, FNP      PDMP not reviewed this encounter.   Iola Lucie, [place] 06/07/24 1407

## 2024-06-07 NOTE — ED Triage Notes (Signed)
 Pt being seen in UC for productive cough that has been going on for approximately 1 month. Pt reports having pain in abdomen, chest, and back when coughing. Pt denies fevers. Pt reports taking prescribed medication when first diagnosed but has since gotten worse.
